# Patient Record
Sex: Female | Born: 1945
Health system: Midwestern US, Academic
[De-identification: ages and names within clinical notes are randomized; demographics above are authoritative.]

---

## 2020-01-24 IMAGING — MR FOOTRTWW
5 of 10 series · 18 of 40 positions shown · non-contrast
Comparison: none

Procedure(s): MR foot RT wo/w con

MRI OF THE RIGHT FOOT WITH AND WITHOUT CONTRAST
HISTORY: History of drainage along medial great toe pain and
swelling possible osteomyelitis

[Series 3: T1 · sagittal · 3.0mm · 0.35mm/px · 3 of 30 slices shown (1 of 5)]
[im 1/30]
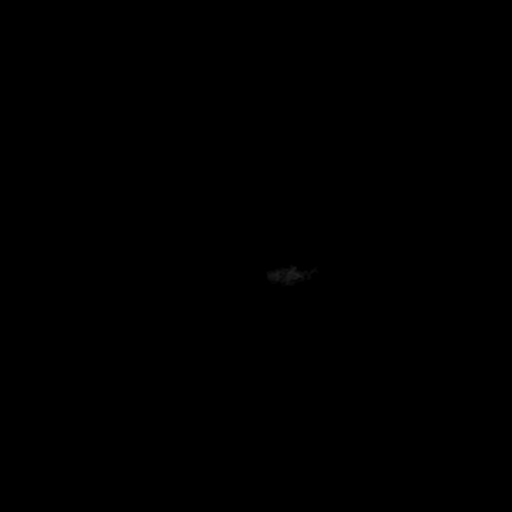
[im 15/30]
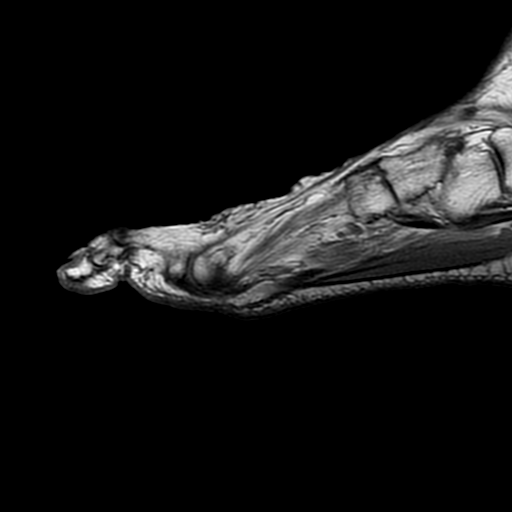
[im 30/30]
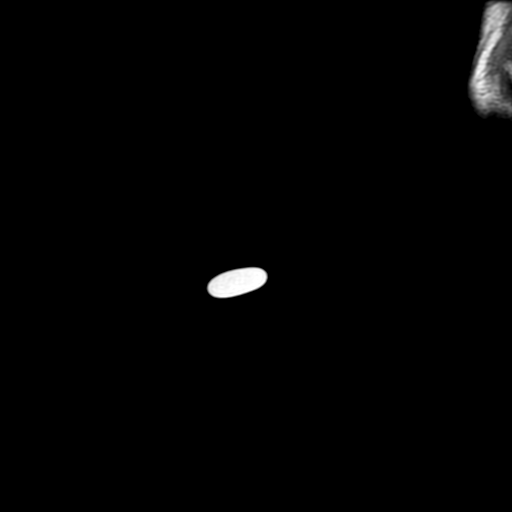

[Series 5: T1 · coronal · 4.0mm · 0.25mm/px · 5 of 33 slices shown (2 of 5)]
[im 1/33]
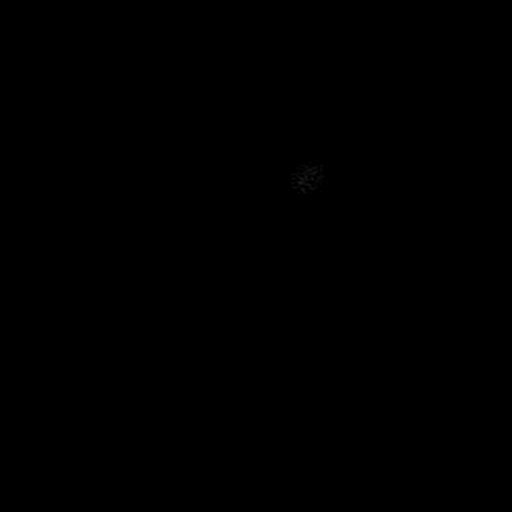
[im 9/33]
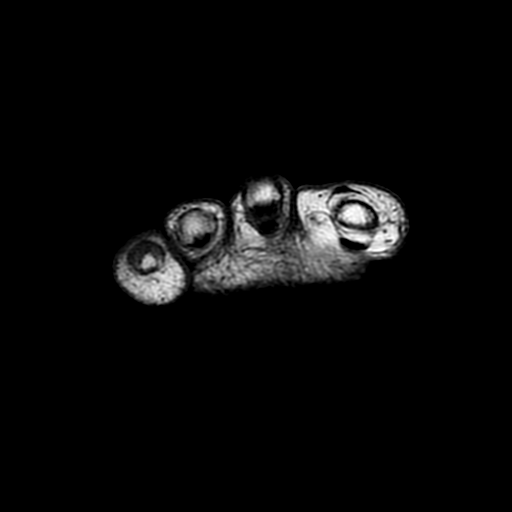
[im 17/33]
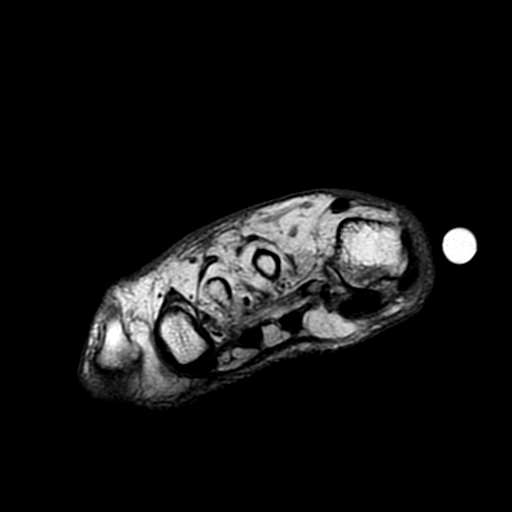
[im 25/33]
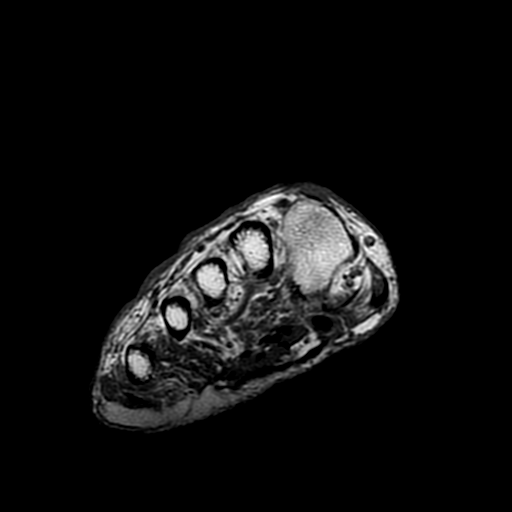
[im 33/33]
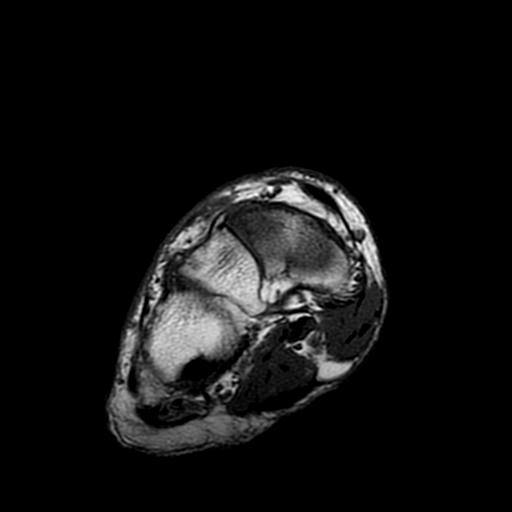

[Series 7: T1 · axial · 3.0mm · 0.31mm/px · z∈[-117,-47]mm · 4 of 26 slices shown (3 of 5)]
[im 1/26]
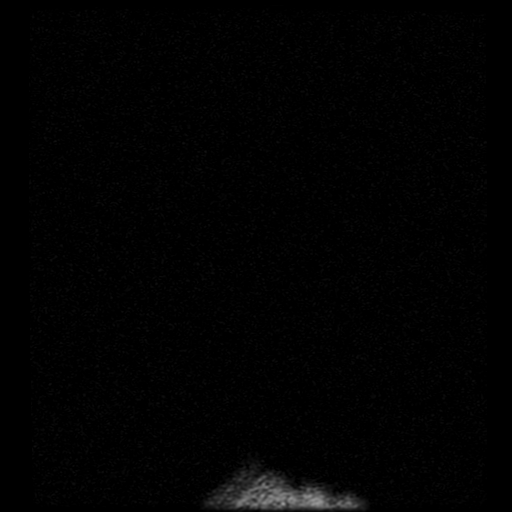
[im 9/26]
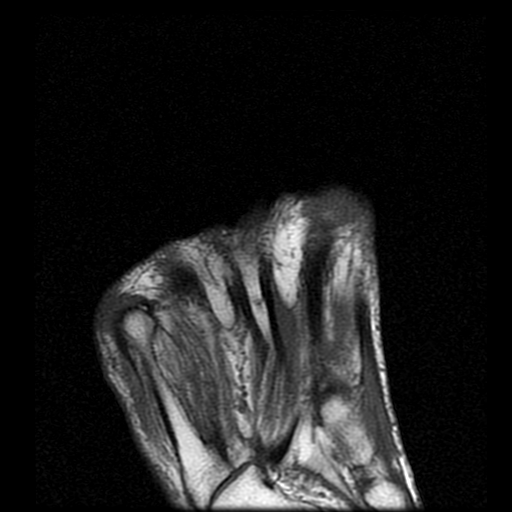
[im 17/26]
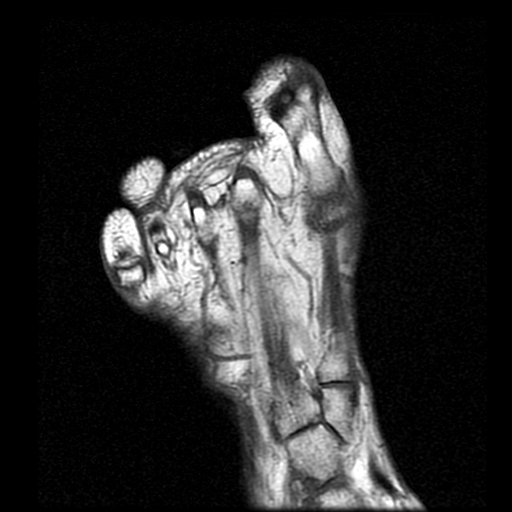
[im 26/26]
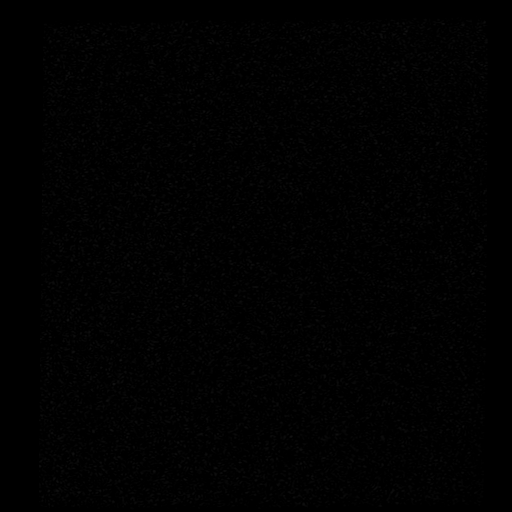

[Series 300: T1 · axial · 3.0mm · 0.31mm/px · z∈[-141,-63]mm · 4 of 26 slices shown (4 of 5)]
[im 1/26]
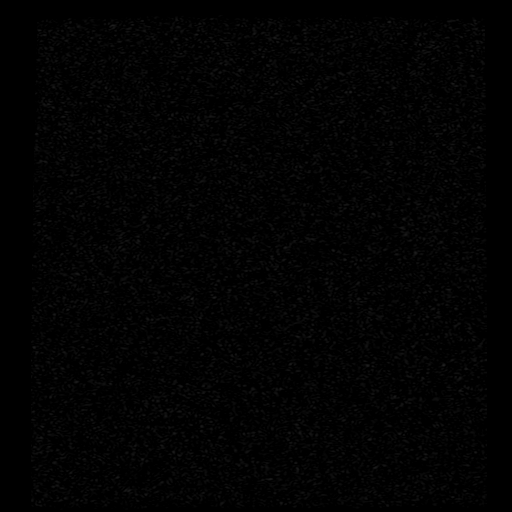
[im 9/26]
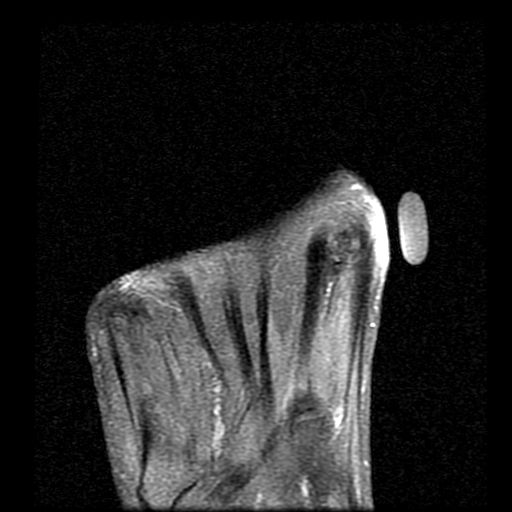
[im 17/26]
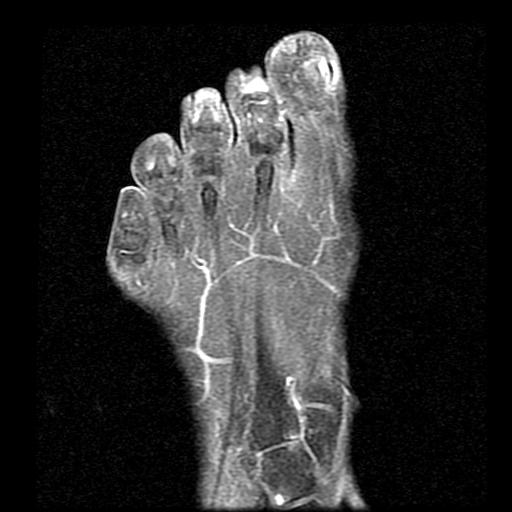
[im 26/26]
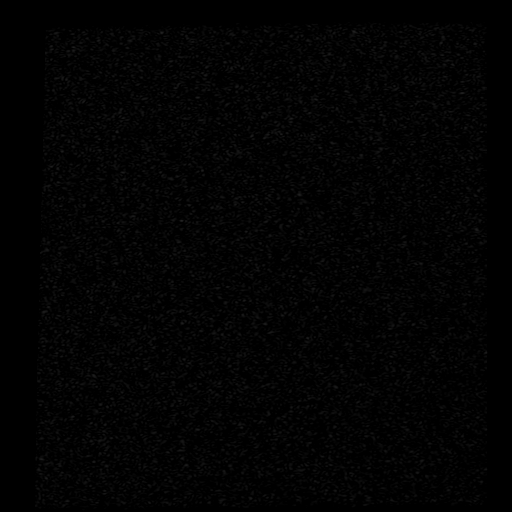

[Series 400: T1 · coronal · 4.0mm · 0.25mm/px · 2 of 33 slices shown (5 of 5)]
[im 1/33]
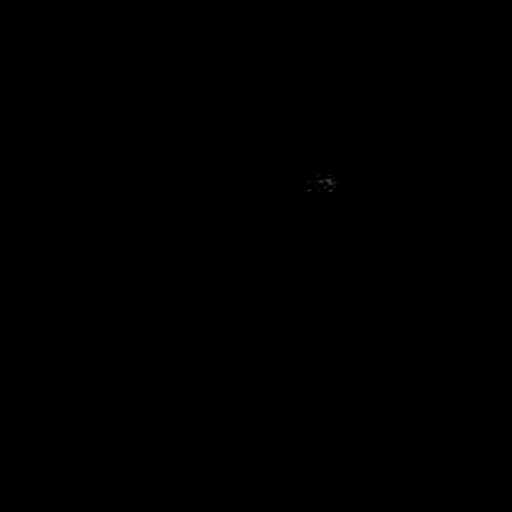
[im 9/33]
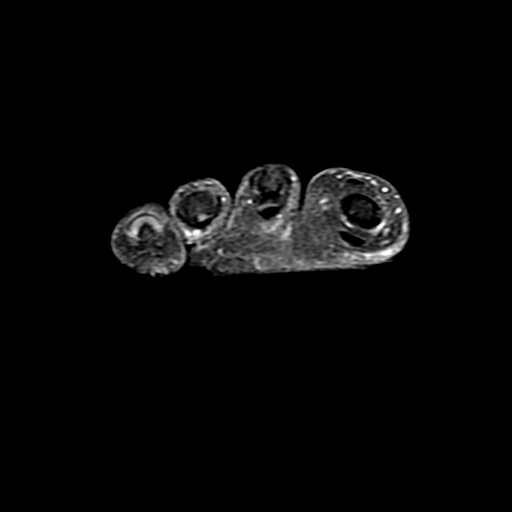

[18 of 40 positions shown; findings below may reference images not displayed]

FINDINGS: Standard imaging protocol was done for evaluation of the
right forefoot with and without contrast. A total of 13 cc
of Omniscan was administered for the study. A marker was
placed over the area the patient's symptoms along the first
metatarsal phalangeal joint.

Degenerative changes are noted along the metatarsal sesamoid
joint with degenerative narrowing of the first metatarsal
phalangeal joint. No acute bone edema or fracture is
evident. Visualized tarsals appear normal. There is normal
tarsometatarsal alignment in the Lisfranc ligament is
intact. Flexor and extensor tendons appear normal. No
muscular abnormality is evident. No focal fluid collection
is seen to suggest an abscess. No soft tissue mass is seen
in the intermetatarsal spaces to suggest a Morton's neuroma.
In the area the patient's symptoms by marker there is a
focal area of soft tissue inflammation correlating with the
patient's medial wound. No underlying involvement of the
bone is seen to suggest osteomyelitis. No focal fluid
collection is seen to suggest an abscess. After the
administration of contrast, soft tissue enhancement is noted
along the subcutaneous tissues in the area of skin wound
without findings of abnormal enhancement in the underlying
bone to suggest osteomyelitis. No other areas of abnormal
enhancement are seen.
IMPRESSION: 1. In the area the patient's wound by marker there is soft
tissue inflammation in the underlying subcutaneous soft
tissues without findings of focal fluid collection to
suggest abscess.

2. Degenerative tapering of the first metatarsal phalangeal
joint and sesamoidal metatarsal joint is demonstrated. There
are no findings of abnormal signal to suggest osteomyelitis
or fracture.

3. No muscular abnormalities are demonstrated. The flexor
and extensor tendons appear normal.

## 2021-03-29 IMAGING — DX XR foot RT 2V
2 series · 2 of 2 positions shown · non-contrast
Comparison: none

Procedure(s): XR foot RT 2V

RIGHT FOOT X-RAY, TWO VIEWS
CLINICAL DATA: First digit 1. Pain, swelling, and erythema.

[foot lat]
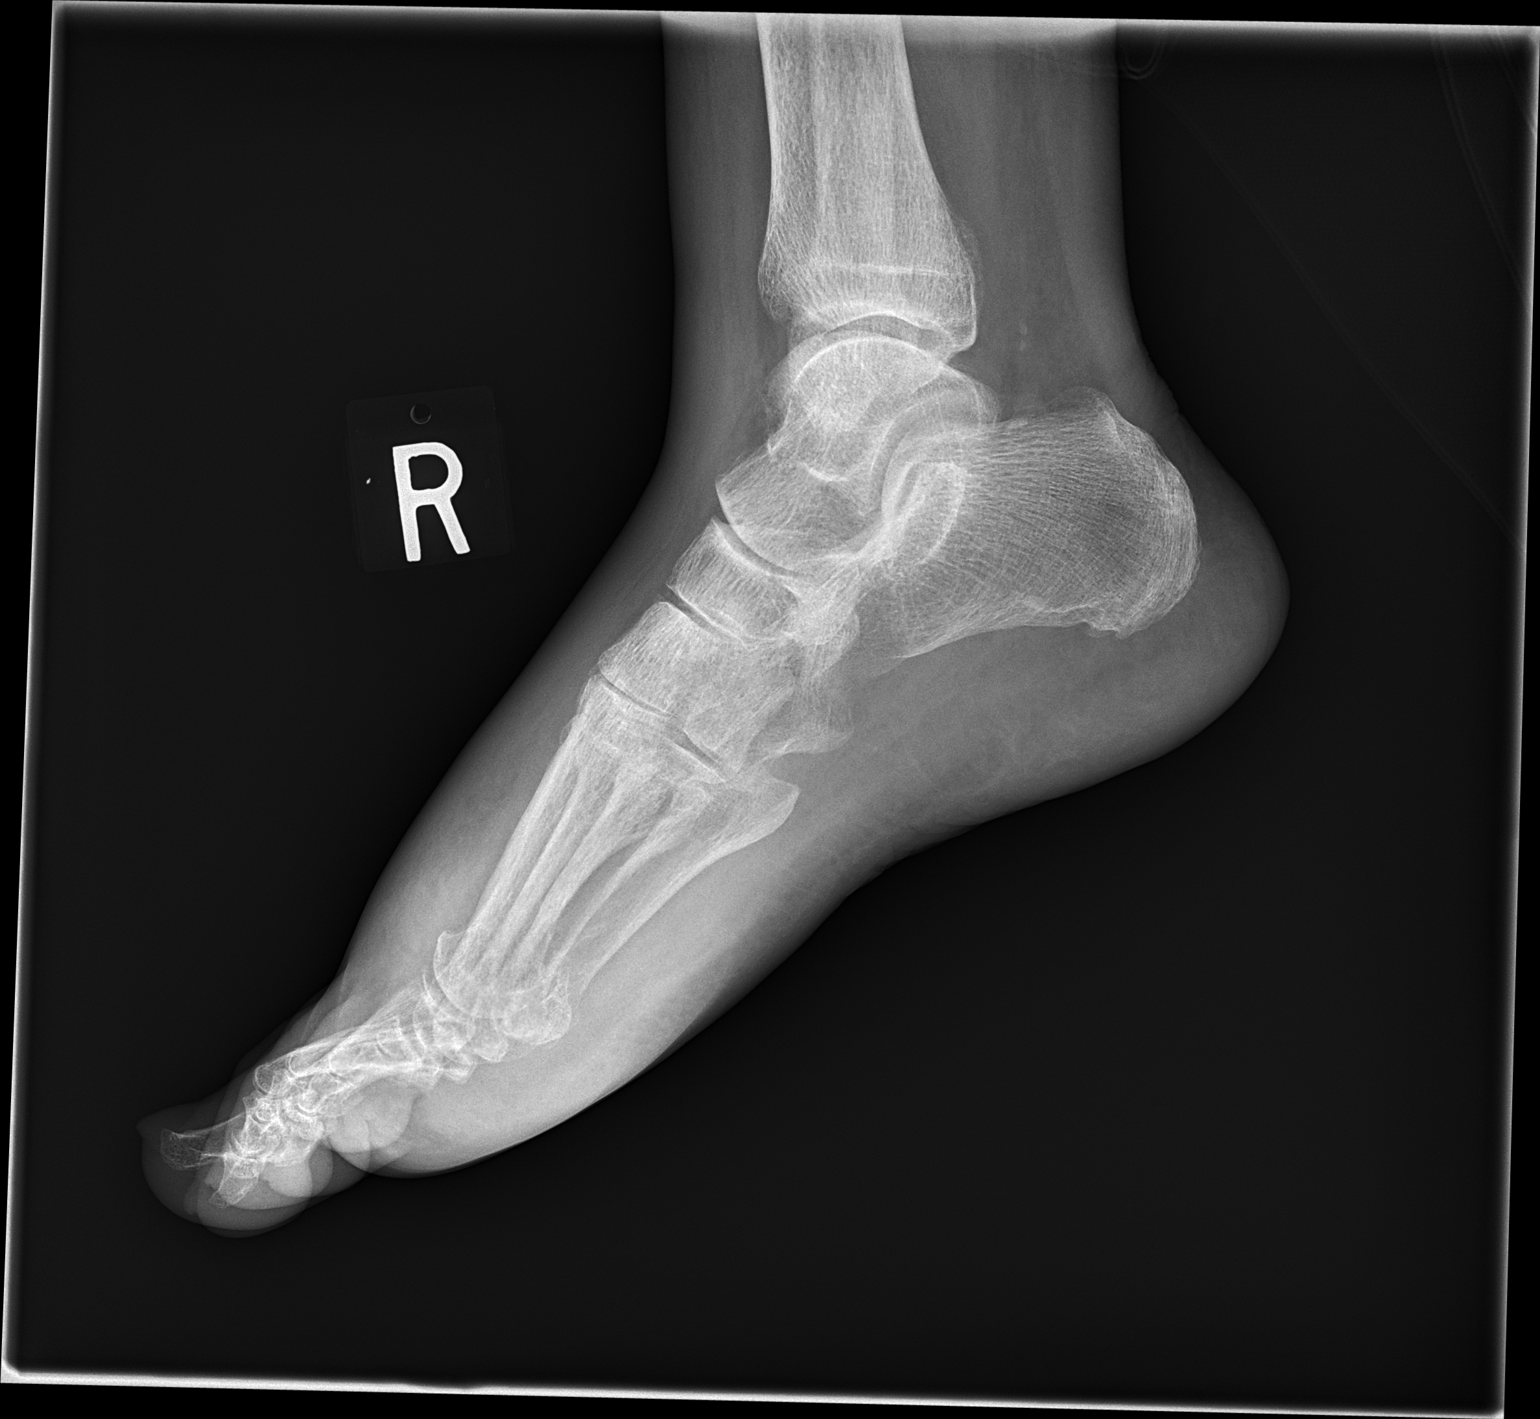

[foot ap]
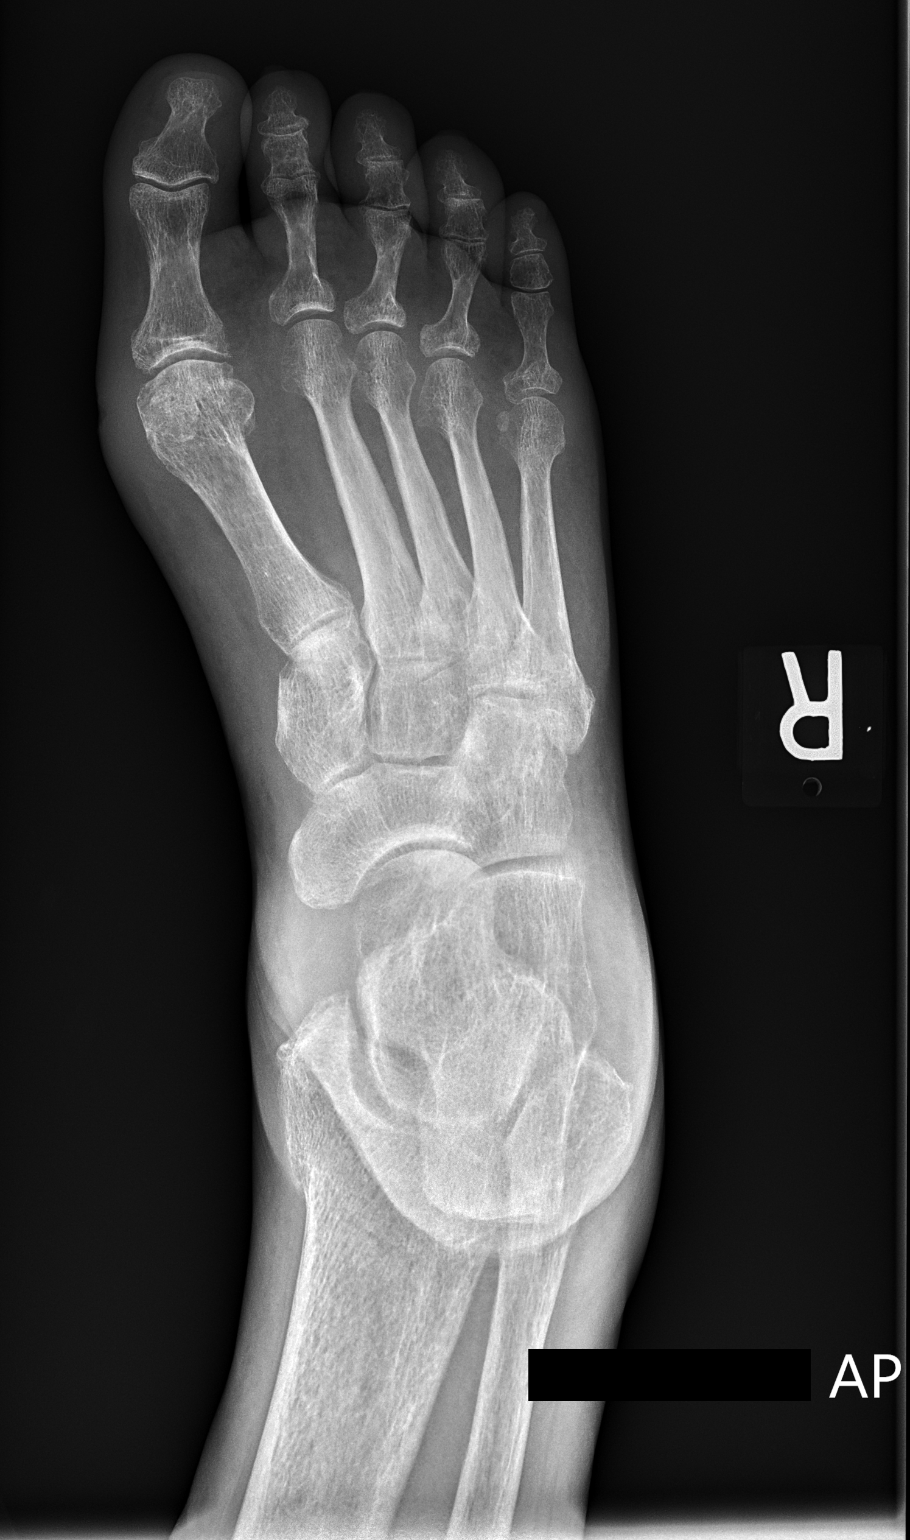

[2 of 2 positions shown; findings below may reference images not displayed]

FINDINGS: No acute fracture deformities or dislocations are detected.
No obvious underlying bony abnormality is seen. The joint
spaces are preserved. No radiopaque foreign bodies are
detected. Dorsal soft tissue swelling is present.
IMPRESSION: 1. Dorsal forefoot soft tissue swelling.

2. No bone destruction or other acute abnormality detected.

## 2021-04-05 IMAGING — DX XR foot RT 2V
2 series · 2 of 2 positions shown · non-contrast
Comparison: None.

Procedure(s): XR foot RT 2V

XR foot RT 2V
INDICATION: pain

[foot lat]
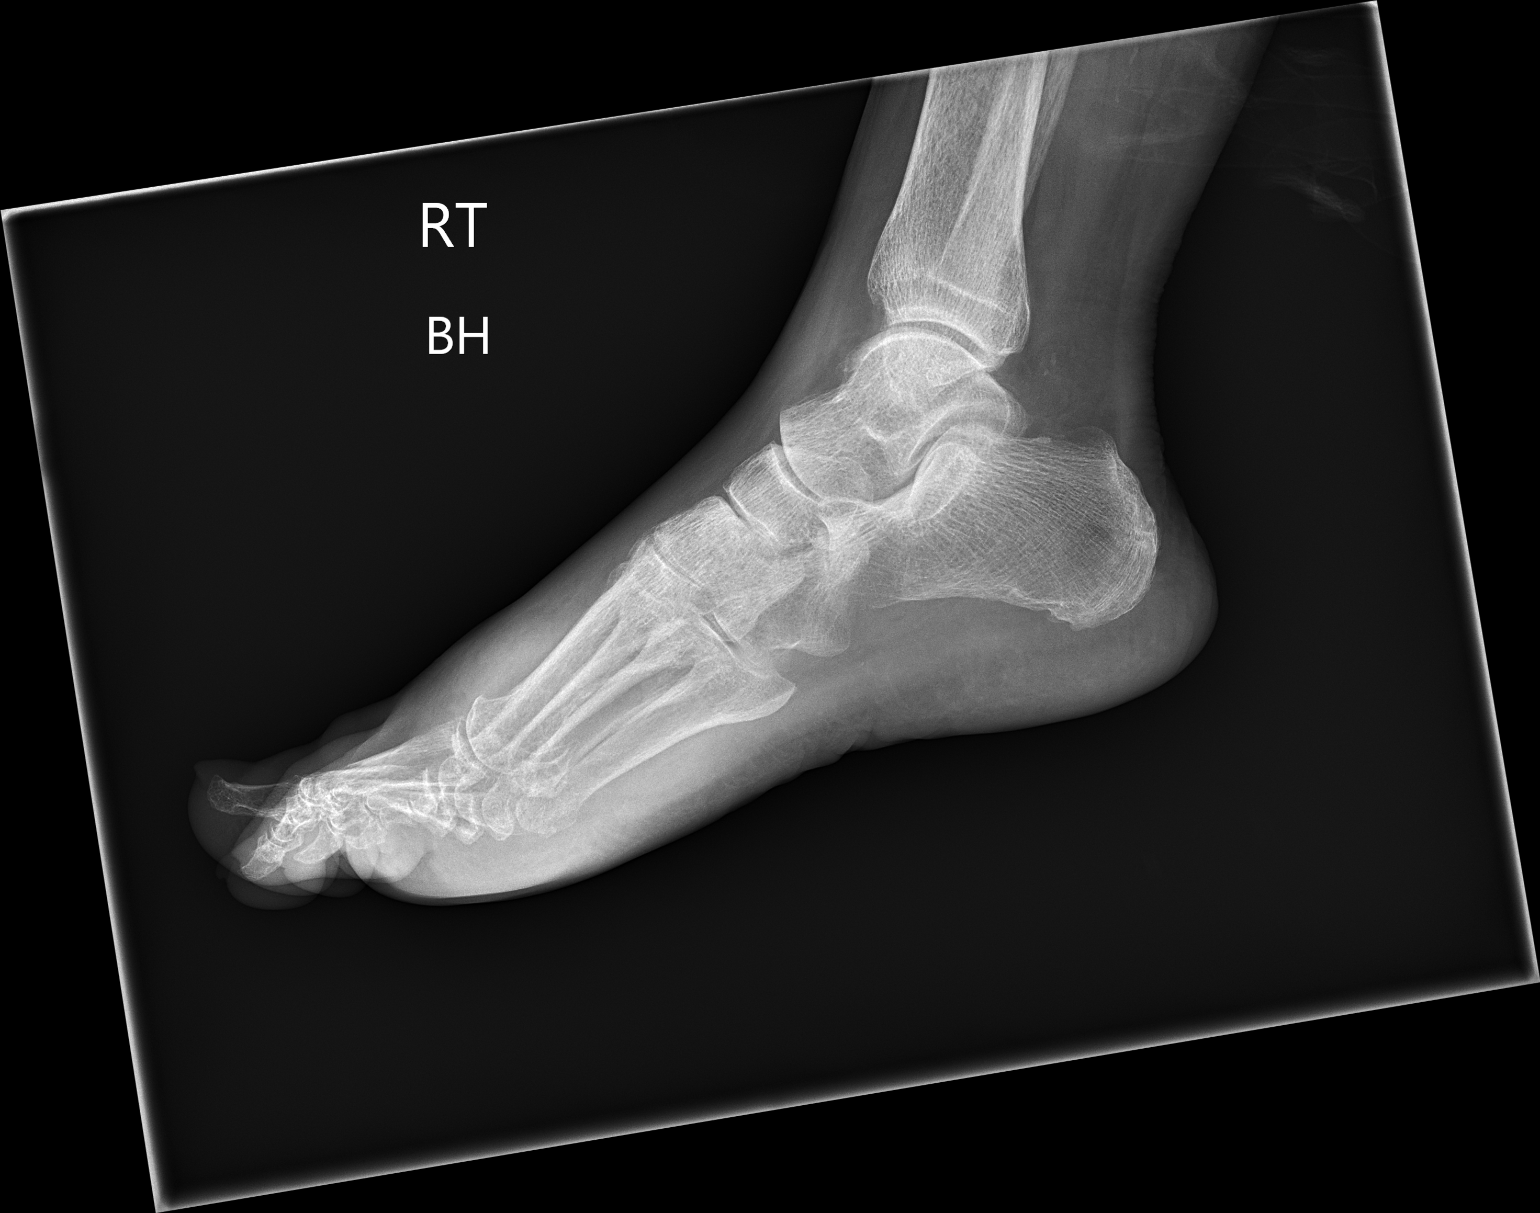

[foot ap]
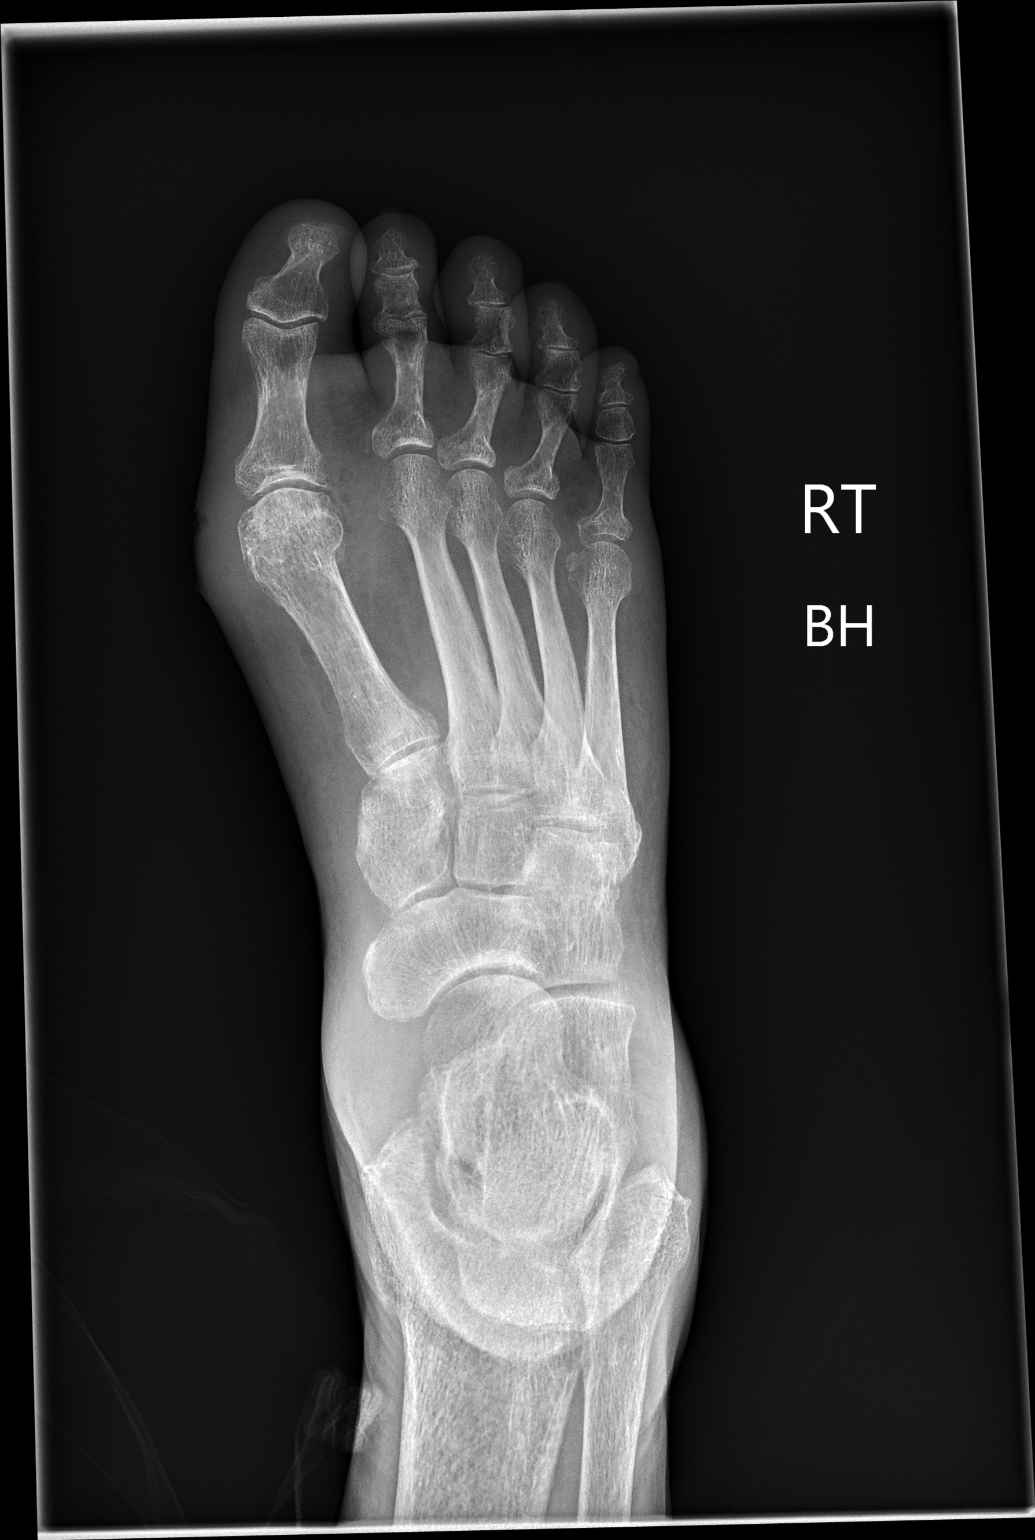

[2 of 2 positions shown; findings below may reference images not displayed]

FINDINGS: No acute fracture or malalignment. Mild midfoot
degenerative arthrosis mild first MTP joint arthrosis. There
is a soft tissue defect along the medial aspect of the foot
at the level of the MTP joint. No radiopaque foreign body.
IMPRESSION: 1. No acute osseous abnormality.

2. Soft tissue defect along the medial aspect of the foot at
the level of the MTP joint. Dorsal foot soft tissue swelling

3. Mild first MTP joint and midfoot degenerative arthrosis.

## 2021-04-06 IMAGING — CT FOOTRTWO
3 series · 15 of 33 positions shown, 18 images · non-contrast
Comparison: none

Procedure(s): CT foot RT wo con

CT OF THE RIGHT FOOT WITHOUT CONTRAST INCLUDING SAGITTAL AND
CORONAL RECONSTRUCTIONS USING MULTIPLANAR RECONSTRUCTION
ALGORITHM
HISTORY: Pain and swelling of foot with cellulitis possible
abscess or osteomyelitis

[Series 25: bone 1.000 · sagittal · 0.56mm/px · 5 of 38 slices shown, 6 images (1 of 2)]
[im 13/38  bone]
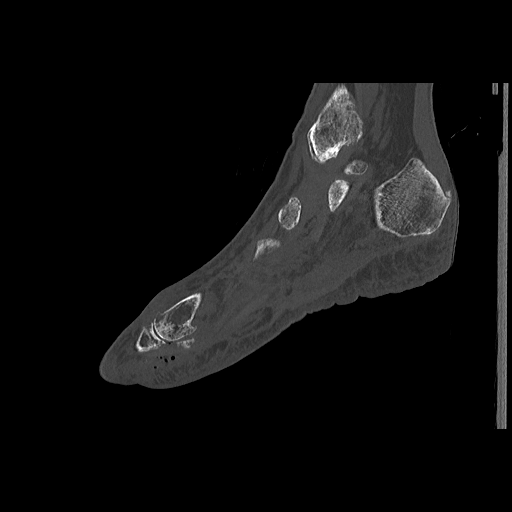
[im 16/38  bone]
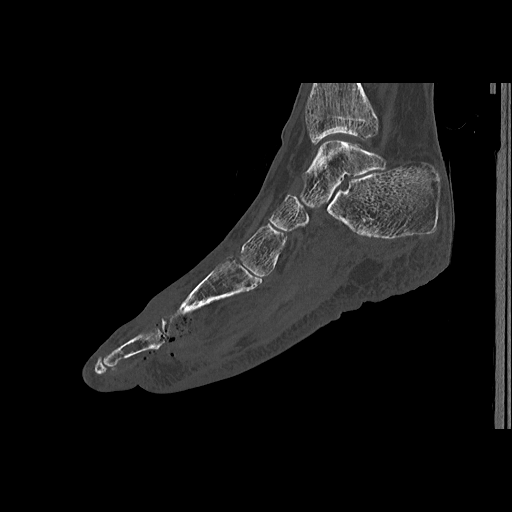
[im 19/38  soft-tissue]
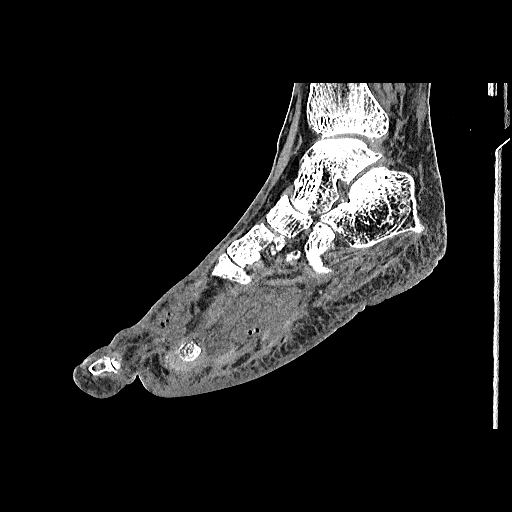
[im 19/38  bone]
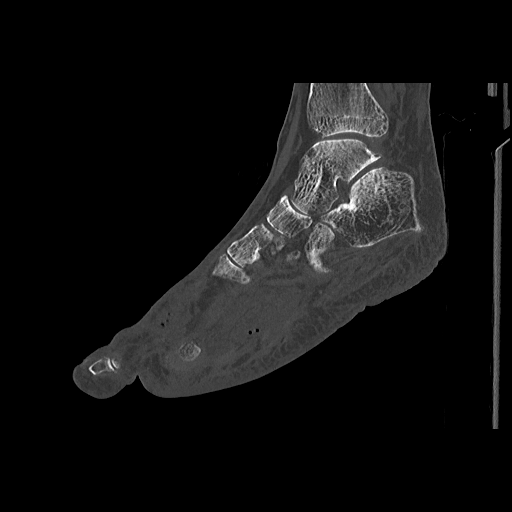
[im 22/38  bone]
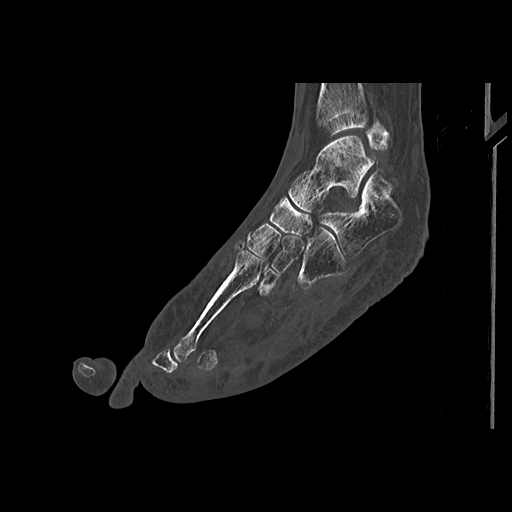
[im 25/38  bone]
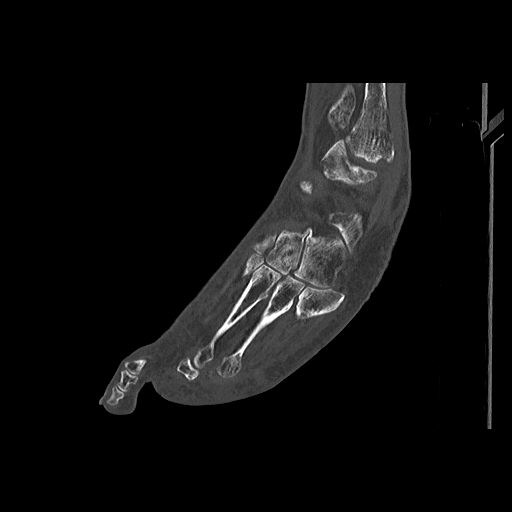

[Series 26: bone 1.000 · coronal · 0.56mm/px · 3 of 74 slices shown (2 of 2)]
[im 15/74  bone]
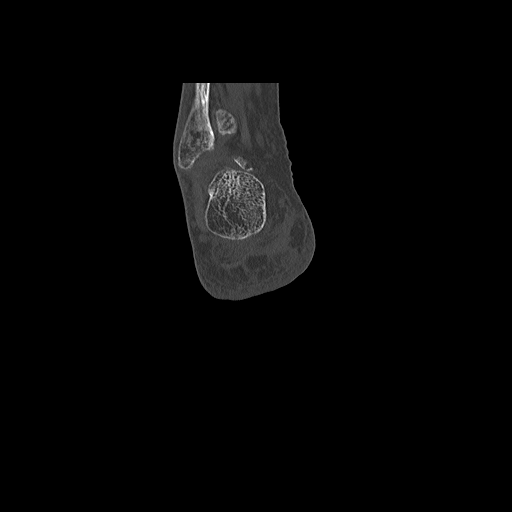
[im 30/74  bone]
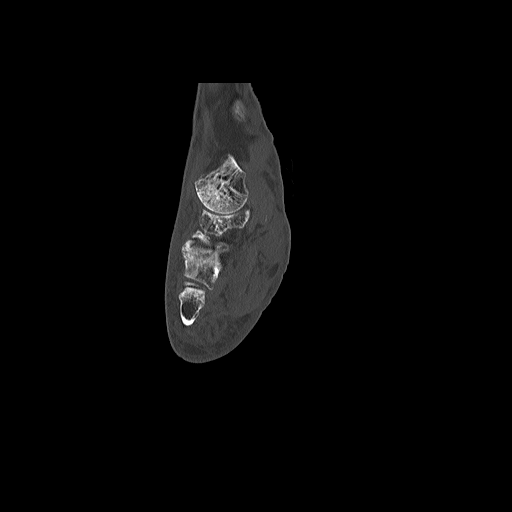
[im 44/74  bone]
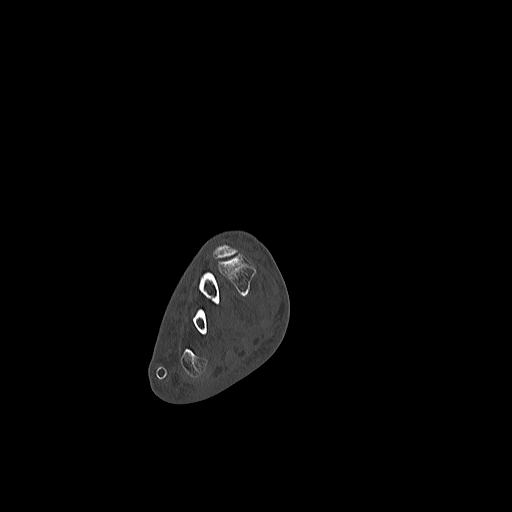

[Series 27: soft tissue aice 1.000 · axial · 0.56mm/px · z∈[+588,+741]mm · 7 of 61 slices shown, 9 images]
[im 5/61  soft-tissue]
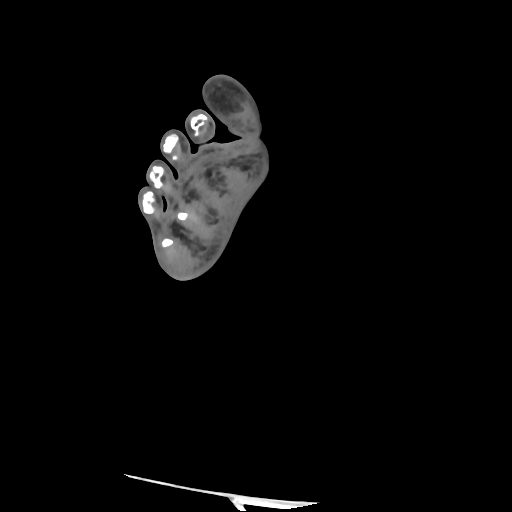
[im 5/61  bone]
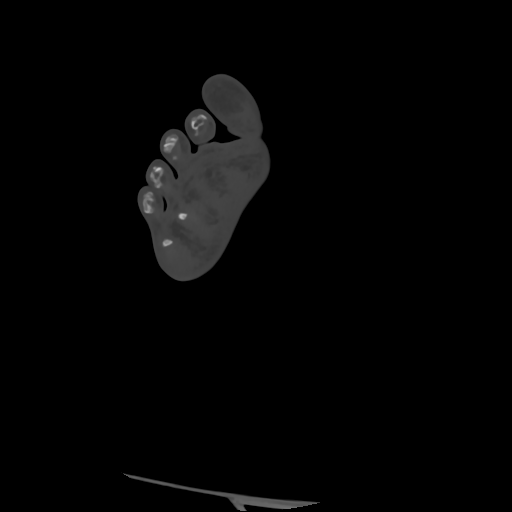
[im 14/61  bone]
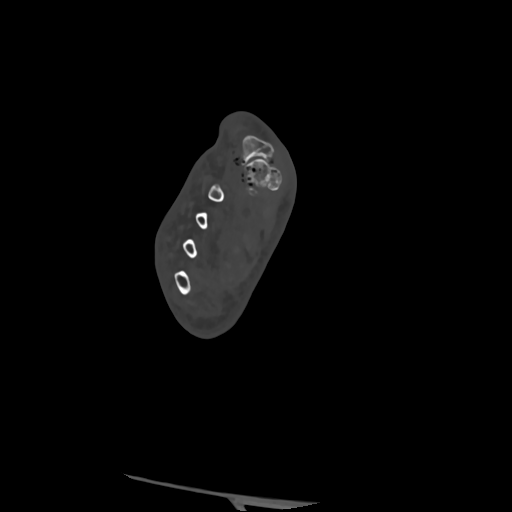
[im 24/61  bone]
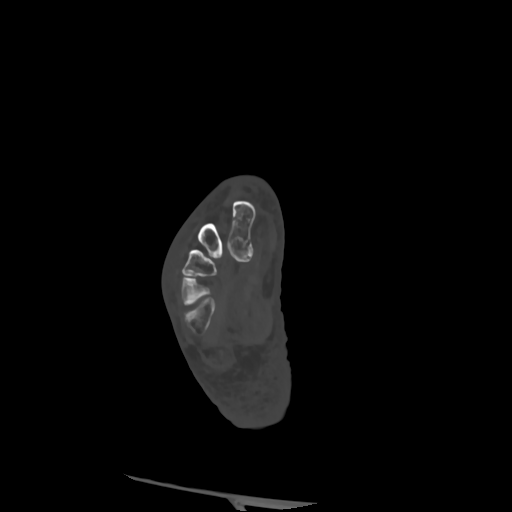
[im 33/61  bone]
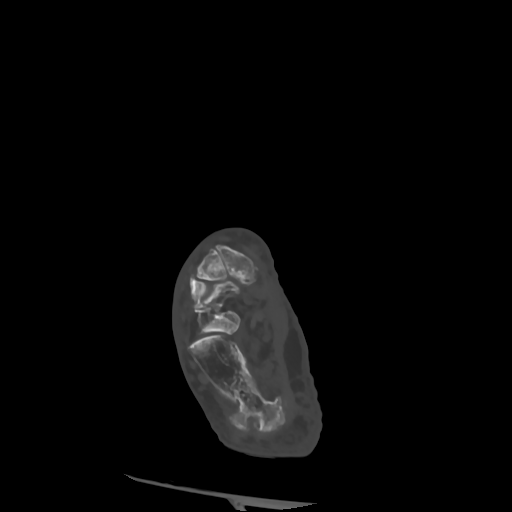
[im 37/61  soft-tissue]
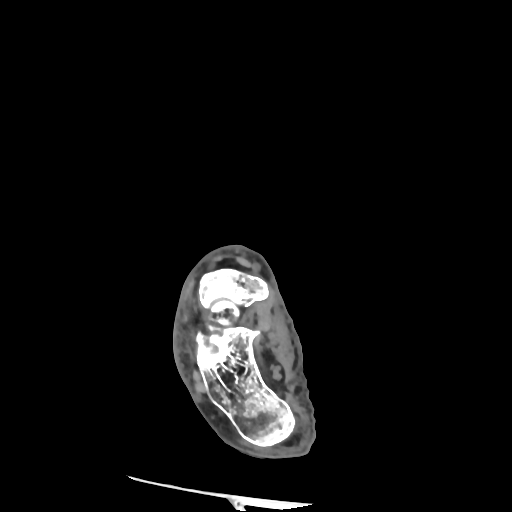
[im 37/61  bone]
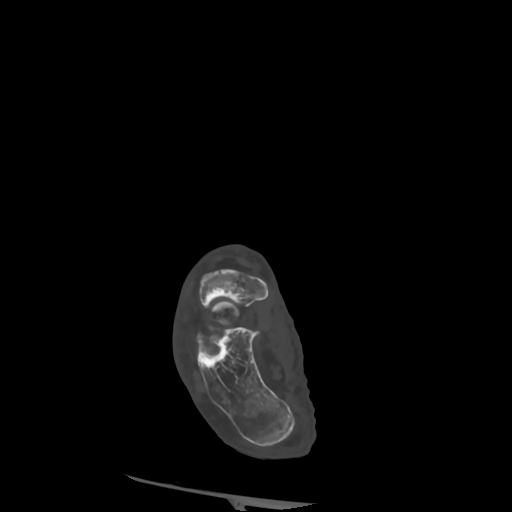
[im 47/61  bone]
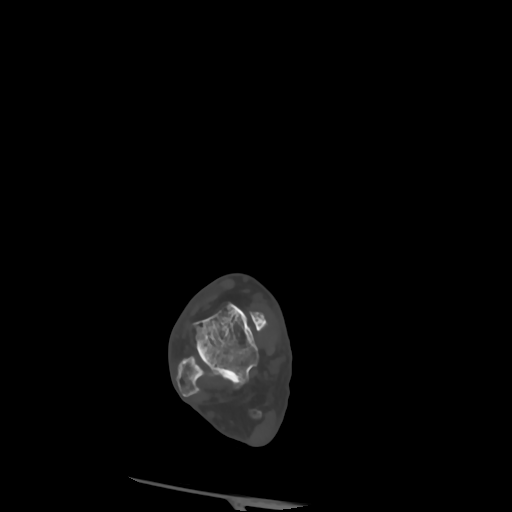
[im 56/61  bone]
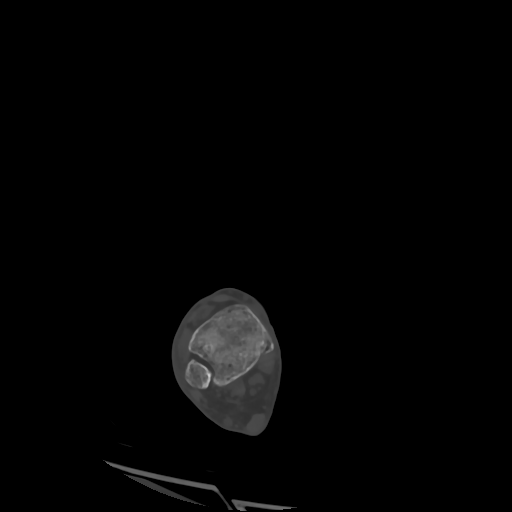

[15 of 33 positions shown; findings below may reference images not displayed]

FINDINGS: Contiguous 3 mm axial tomograms are taken through the right
foot and reconstructed in the sagittal and coronal planes
using multiplanar reconstruction algorithm. Automated
exposure control dose reduction technique was utilized for
the exam.

Subcutaneous air is demonstrated extending through the foot
along the plantar surface of the foot at the level of the
first metatarsal phalangeal joint and proximal phalanx. The
findings are consistent with soft tissue infection and air
within the soft tissues. Clinical correlation is recommended
for the soft tissue infection in this location. Lucency
extends along the dorsal aspect of the distal first
metatarsal head on series 25 images 11 through 15 felt to
represent early changes of osteomyelitis. Definite
periosteal reaction or lucency in the proximal phalanx of
the great toe is not definitively seen. There does appear to
be a small amount of air along the joint space in the first
metatarsal phalangeal joint felt to represent septic joint.
Degenerative changes present in the sesamoids and first
metatarsal phalangeal joint area. The distal phalanx shows
no evidence of osteomyelitis. The rest of the metatarsals
and phalanges appear intact. There are findings of soft
tissue air and fluid extending more laterally along the
plantar aspect of the foot deep to the second and third
plantar soft tissues. Findings are concerning for an
underlying abscess with the fluid and air. This area extends
over a 3 x 2.6 cm area on series 22 image 47. There is
additional fluid and inflammation extending along the great
toe and first metatarsal phalangeal joint, with the mixed
fluid and air the findings are concerning for abscess
formation along the first metatarsal phalangeal joint soft
tissues. MRI is more sensitive to evaluate for an underlying
abscess.
IMPRESSION: 1. Subcutaneous air extends along the subcutaneous tissues
of the first metatarsal phalangeal joint soft tissues
laterally in addition to a collection of air and fluid along
the plantar surface of the mid second and third metatarsal
plantar soft tissues series 22 image 47 measuring 3.1 x
cm in size. The findings are concerning for underlying
abscess formation with the air formation and clinical
correlation is recommended for any infection with air
forming organism. Soft tissue edema extends through the
great toe.

2. Correlation with MRI is recommended as the findings are
concerning for abscess formation along the flexor tendons of
the second and third metatarsals in addition to the first
metatarsal phalangeal joint.

3. Lucency extends along the dorsal aspect of the distal
first metatarsal head suggesting early changes of
osteomyelitis in this location. There is air and fluid in
the joint at the first metatarsal phalangeal joint
concerning for septic joint on top of the degenerative
change.

## 2021-04-09 IMAGING — MR MR foot RT wo/w con
4 of 9 series · 18 of 40 positions shown · IV contrast (omniscan)
Comparison: CT 04/06/2021 and radiographs 04/05/2021.

Procedure(s): MR foot RT wo/w con

EXAM: MRI of the right foot with and without IV contrast
HISTORY: Nonhealing wound great toe for 1 year.
TECHNIQUE: Standard MRI of the foot with and without IV
contrast following administration of 14 mL IV Omniscan.

[Series 3: T1 · sagittal · 3.0mm · 0.35mm/px · 5 of 32 slices shown (1 of 4)]
[im 1/32]
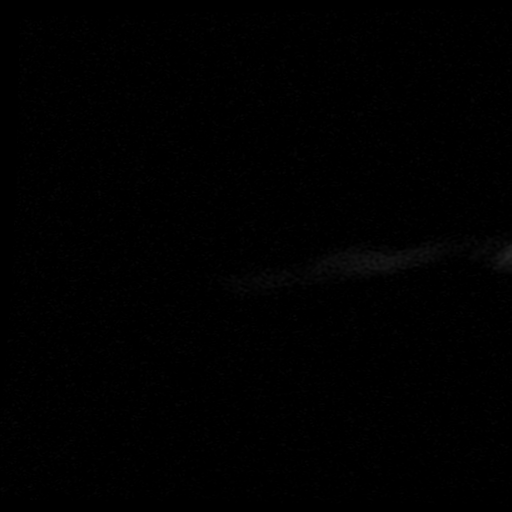
[im 8/32]
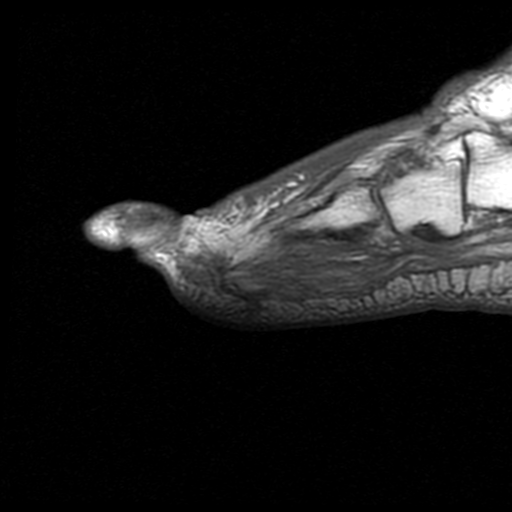
[im 16/32]
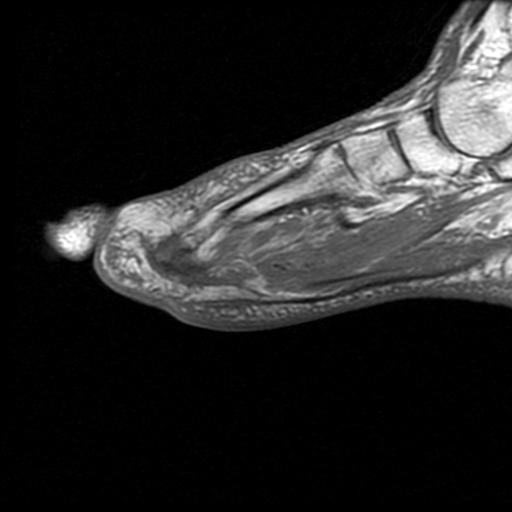
[im 24/32]
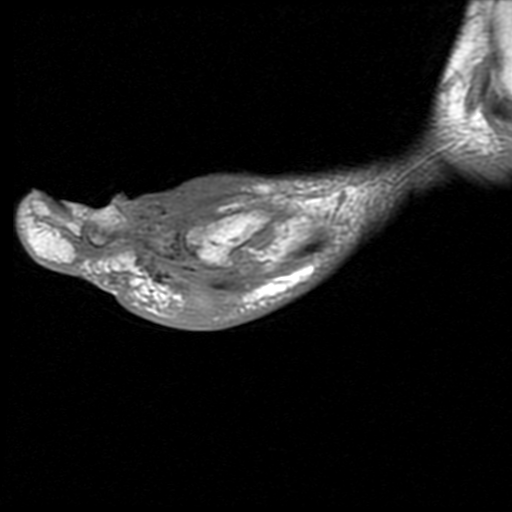
[im 32/32]
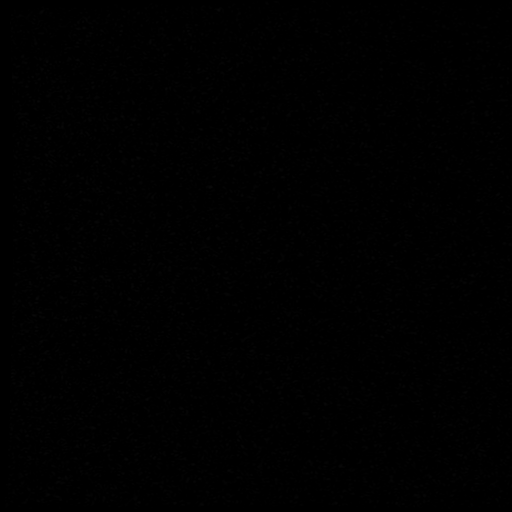

[Series 5: T1 · coronal · 4.0mm · 0.25mm/px · 5 of 33 slices shown (2 of 4)]
[im 1/33]
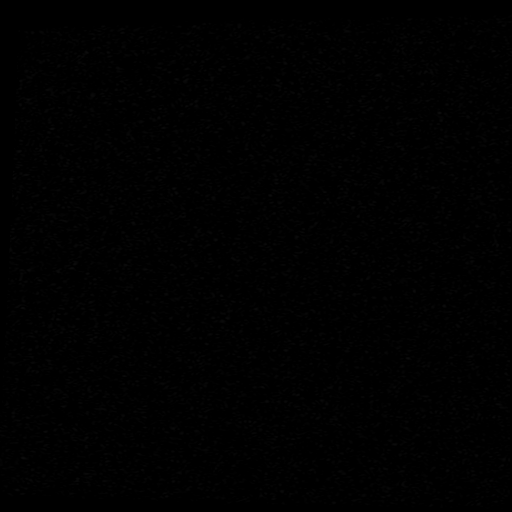
[im 9/33]
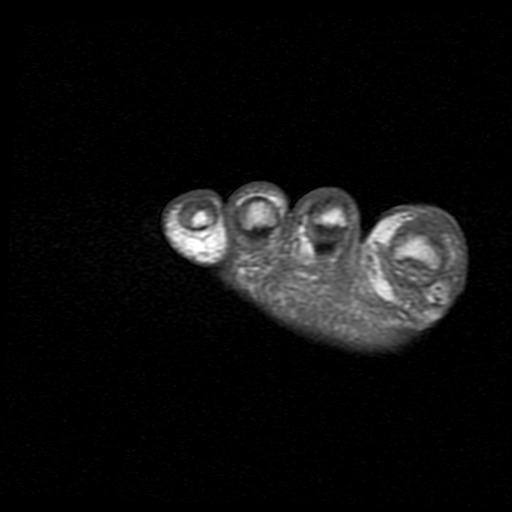
[im 17/33]
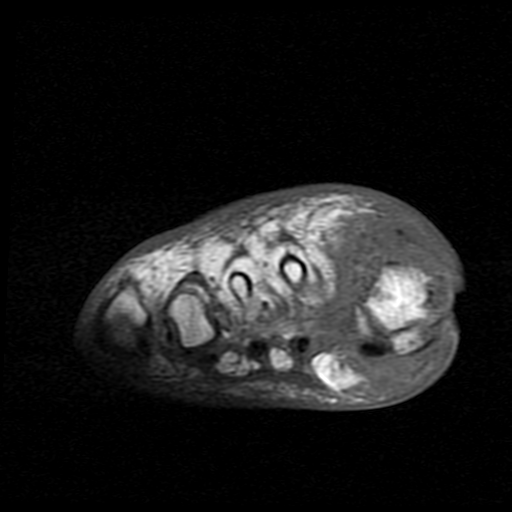
[im 25/33]
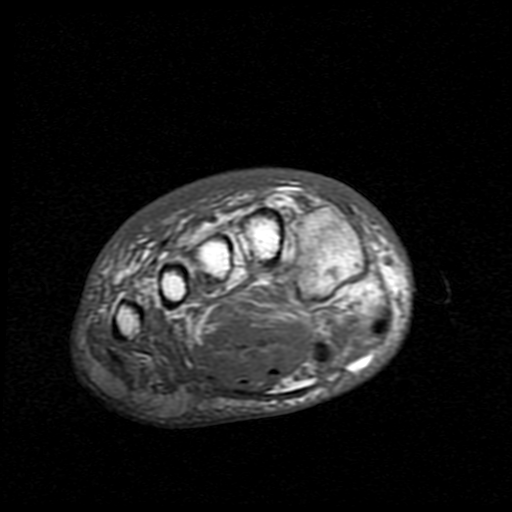
[im 33/33]
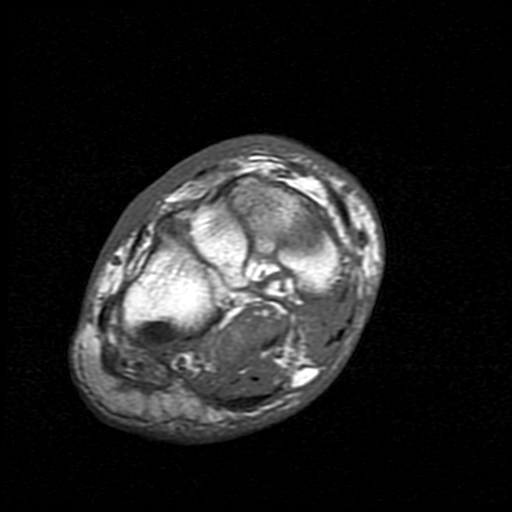

[Series 7: T1 · oblique · 3.0mm · 0.31mm/px · 4 of 24 slices shown (3 of 4)]
[im 1/24]
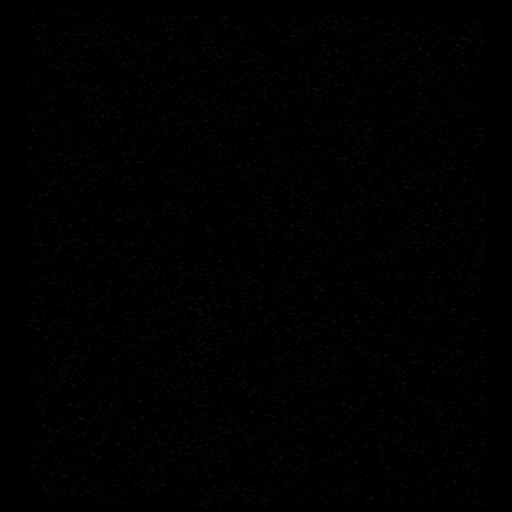
[im 8/24]
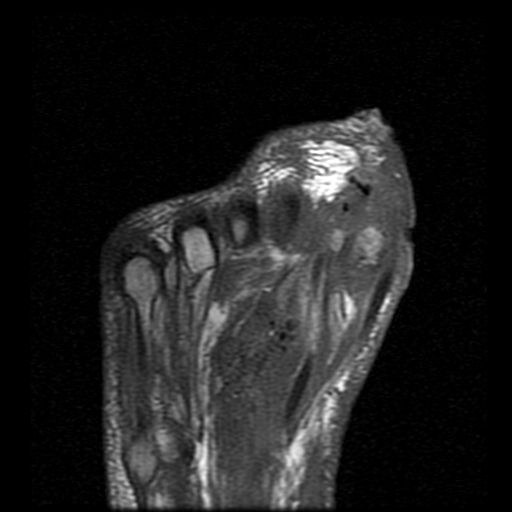
[im 16/24]
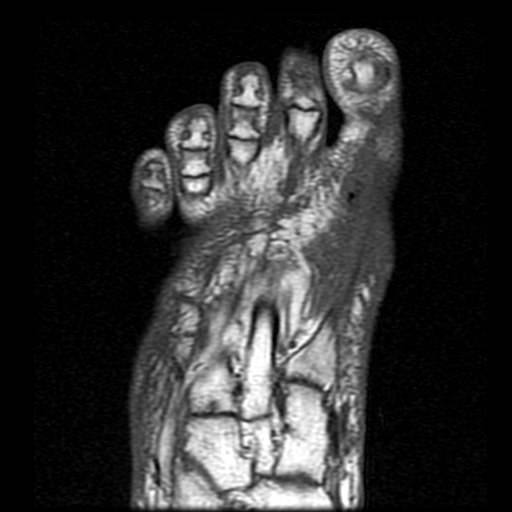
[im 24/24]
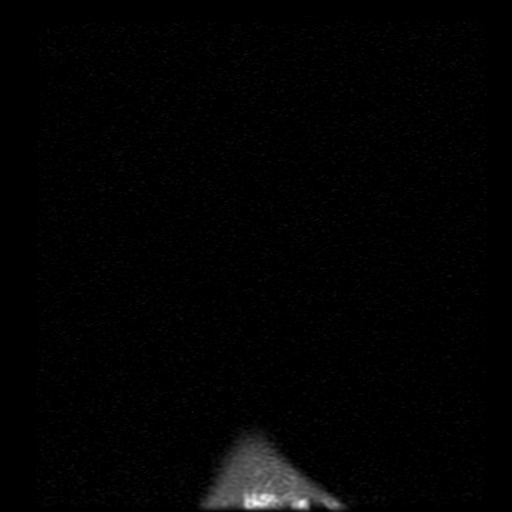

[Series 9: T1 · oblique · 3.0mm · 0.31mm/px · 4 of 24 slices shown (4 of 4)]
[im 1/24]
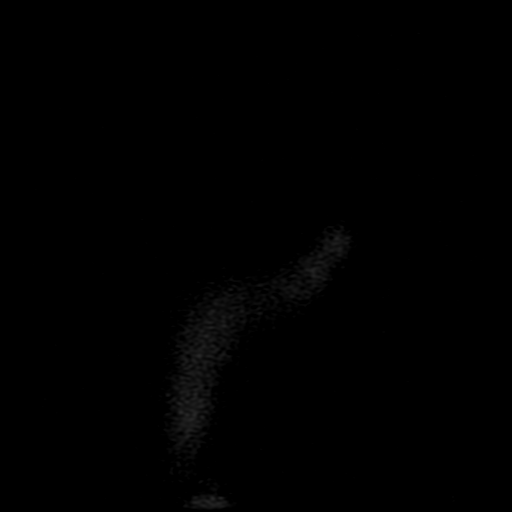
[im 8/24]
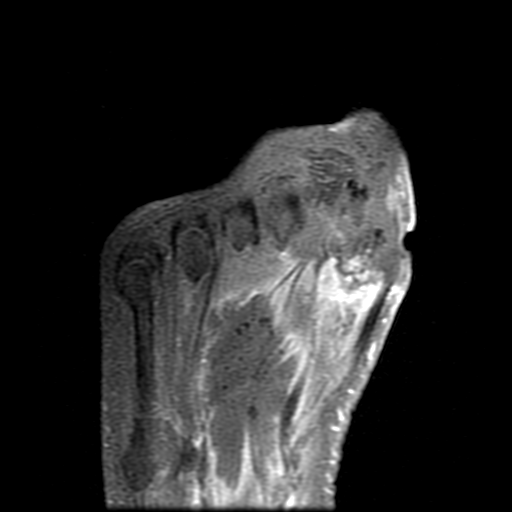
[im 16/24]
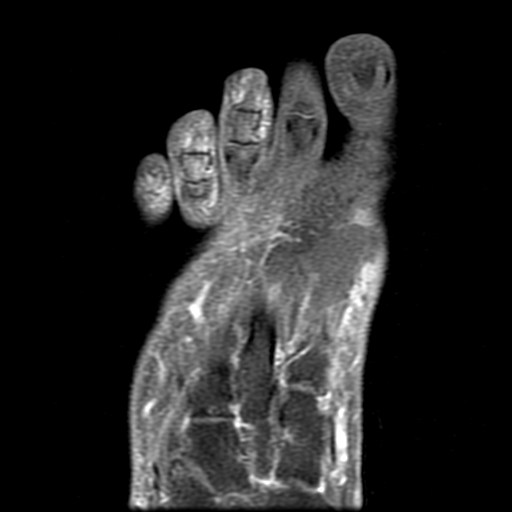
[im 24/24]
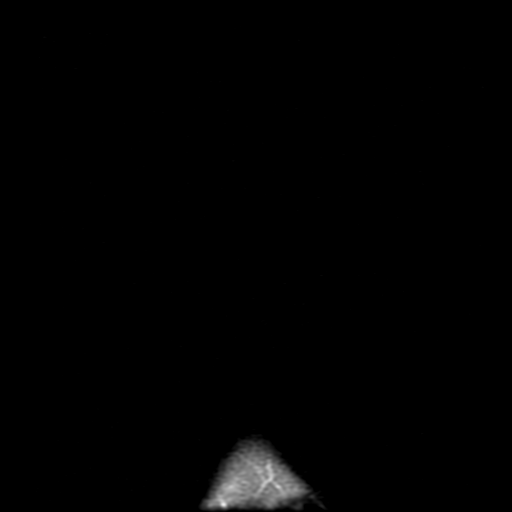

[18 of 40 positions shown; findings below may reference images not displayed]

FINDINGS: As demonstrated on the comparison CT there is a irregular
soft tissue wound/ulcer at the medial aspect of the forefoot
at the level of the first metatarsal head. The wound/ulcer
measures 1.6 cm longitudinal and 1.4 cm AP with a depth of
3-4 mm and the wound extending to the medial cortical margin
of the first metatarsal head. There is abnormal marrow
signal alteration with low T1 and increased T2 signal and
enhancement within the first metatarsal most consistent with
osteomyelitis. There are manifestations of extensive soft
tissue infection more so along the lateral margin of the
great toe from the metatarsal head to the proximal
phalangeal level where there are innumerable low signal foci
consistent with soft tissue gas. Multiple locules of soft
tissue gas also noted within the plantar midfoot tracking
along the flexor tendons and musculature more so at the
level of the second and third metatarsals. There is
rim-enhancing fluid collections within the soft tissues
measuring up to approximately 10 cm longitudinal, 4.4 cm
transverse and 2.8 cm AP consistent with extensive abscess
and soft tissue phlegmon. Abscess is also noted protruding
dorsally between the first and second metatarsal heads and
along the lateral margin of the great toe with this area
measuring an additional 4.3 x 2.2 x 2.3 cm.

The midfoot alignment is maintained with intact Lisfranc
ligament.

No definitive osteomyelitis within the second through fifth
metatarsals or within the second through fifth toes.
IMPRESSION: Deep soft tissue wound/ulcer at the medial aspect of the
forefoot at the first metatarsal head level which appears to
extend to the cortical margin.

Marrow signal alteration and enhancement within the first
metatarsal consistent with osteomyelitis.

Manifestations of marked soft tissue infection with
abscess/phlegmon throughout the plantar aspect of the mid
foot from the level of the cuneiforms to the MTP joints as
well as significant abscess and phlegmon surrounding the
great toe more so dorsal laterally as detailed above.

## 2021-04-16 ENCOUNTER — Encounter: Admit: 2021-04-16 | Discharge: 2021-04-16

## 2021-04-16 NOTE — Progress Notes
75yo female with PMH of DM and diabetic ulcer on R-foot was admitted on 6/6 for OM and pain in R-foot, Sending physician recommends amputation.
